# Patient Record
Sex: Female | Born: 1995 | Race: Black or African American | Hispanic: No | Marital: Single | Smoking: Never smoker
Health system: Southern US, Community
[De-identification: ages and names within clinical notes are randomized; demographics above are authoritative.]

## PROBLEM LIST (undated history)

## (undated) HISTORY — PX: OVARY SURGERY: SHX727

---
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
## 2018-02-16 ENCOUNTER — Encounter: Payer: Self-pay | Admitting: Emergency Medicine

## 2018-02-16 ENCOUNTER — Emergency Department
Admission: EM | Admit: 2018-02-16 | Discharge: 2018-02-16 | Disposition: A | Discharge status: Home / Self Care | Payer: Managed Care, Other (non HMO) | Attending: Emergency Medicine | Admitting: Emergency Medicine

## 2018-02-16 ENCOUNTER — Other Ambulatory Visit: Payer: Self-pay

## 2018-02-16 DIAGNOSIS — M545 Low back pain, unspecified: Secondary | ICD-10-CM

## 2018-02-16 DIAGNOSIS — Y999 Unspecified external cause status: Secondary | ICD-10-CM | POA: Diagnosis not present

## 2018-02-16 DIAGNOSIS — Y92410 Unspecified street and highway as the place of occurrence of the external cause: Secondary | ICD-10-CM | POA: Insufficient documentation

## 2018-02-16 DIAGNOSIS — Y9389 Activity, other specified: Secondary | ICD-10-CM | POA: Diagnosis not present

## 2018-02-16 MED ORDER — CYCLOBENZAPRINE HCL 5 MG PO TABS: ORAL_TABLET | ORAL | 0 refills | Status: DC

## 2018-02-16 MED ORDER — IBUPROFEN 600 MG PO TABS: 600.0000 mg | ORAL_TABLET | Freq: Four times a day (QID) | ORAL | 0 refills | Status: DC | PRN

## 2018-02-16 MED ORDER — KETOROLAC TROMETHAMINE 30 MG/ML IJ SOLN
30.0000 mg | Freq: Once | INTRAMUSCULAR | Status: AC
  Administered 2018-02-16: 30 mg via INTRAMUSCULAR
  Filled 2018-02-16: qty 1
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
## 2018-03-12 ENCOUNTER — Emergency Department
Admission: EM | Admit: 2018-03-12 | Discharge: 2018-03-12 | Disposition: A | Discharge status: Home / Self Care | Payer: Managed Care, Other (non HMO) | Attending: Student in an Organized Health Care Education/Training Program | Admitting: Student in an Organized Health Care Education/Training Program

## 2018-03-12 ENCOUNTER — Encounter: Payer: Self-pay | Admitting: Emergency Medicine

## 2018-03-12 DIAGNOSIS — M7918 Myalgia, other site: Secondary | ICD-10-CM | POA: Insufficient documentation

## 2018-03-12 DIAGNOSIS — M549 Dorsalgia, unspecified: Secondary | ICD-10-CM | POA: Diagnosis present

## 2018-03-12 MED ORDER — IBUPROFEN 600 MG PO TABS: 600.0000 mg | ORAL_TABLET | Freq: Four times a day (QID) | ORAL | 0 refills | Status: AC | PRN

## 2018-03-12 MED ORDER — CYCLOBENZAPRINE HCL 5 MG PO TABS: ORAL_TABLET | ORAL | 0 refills | Status: AC
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
## 2018-03-12 NOTE — Discharge Instructions (Addendum)
Follow-up with orthopedics.  You will need to call make an appointment.  Apply wet heat for 15 minutes then ice for 15 minutes.  Take medication as prescribed.  You need to see an orthopedic or your regular doctor for physical therapy.  Work limitations will be no lifting of greater than 10 pounds until evaluated by either your primary care doctor or orthopedic doctor.
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
## 2018-03-13 ENCOUNTER — Emergency Department: Payer: Managed Care, Other (non HMO)

## 2018-03-13 ENCOUNTER — Other Ambulatory Visit: Payer: Self-pay

## 2018-03-13 ENCOUNTER — Emergency Department
Admission: EM | Admit: 2018-03-13 | Discharge: 2018-03-13 | Disposition: A | Discharge status: Home / Self Care | Payer: Managed Care, Other (non HMO) | Attending: Emergency Medicine | Admitting: Emergency Medicine

## 2018-03-13 DIAGNOSIS — R102 Pelvic and perineal pain: Secondary | ICD-10-CM | POA: Diagnosis not present

## 2018-03-13 DIAGNOSIS — D369 Benign neoplasm, unspecified site: Secondary | ICD-10-CM | POA: Diagnosis not present

## 2018-03-13 LAB — URINALYSIS, COMPLETE (UACMP) WITH MICROSCOPIC
Bilirubin Urine: NEGATIVE
Glucose, UA: NEGATIVE mg/dL
HGB URINE DIPSTICK: NEGATIVE
Ketones, ur: 20 mg/dL — AB
Leukocytes, UA: NEGATIVE
Nitrite: NEGATIVE
Protein, ur: NEGATIVE mg/dL
SPECIFIC GRAVITY, URINE: 1.012 (ref 1.005–1.030)
pH: 6 (ref 5.0–8.0)

## 2018-03-13 LAB — POCT PREGNANCY, URINE: Preg Test, Ur: NEGATIVE

## 2018-03-13 NOTE — Discharge Instructions (Addendum)
Your ultrasound today does not show any significant change from last week's MRI.  Continue following up with [facility] as scheduled on August 15.  Return to the ER if you have sudden worsening of symptoms with severe, unrelenting pain. Take ibuprofen 600mg  and tylenol 650mg  every 6 hours to control pain.

## 2018-03-13 NOTE — ED Notes (Signed)
Pelvic pain intermit xfew months, dx with ovarian cysts xfew years ago.

## 2018-03-13 NOTE — ED Triage Notes (Signed)
Pt arrives to ED c/o dermoid cyst to LLQ all the way to RLQ that is 10cm. States pain x 1 week. Wants to be checked to make sure it didn't rupture. Alert, oriented, ambulatory. No distress noted. States doesn't have L ovary.

## 2018-03-13 NOTE — ED Provider Notes (Signed)
[facility] Emergency Department Provider Note  ____________________________________________  Time seen: Approximately 6:27 PM  I have reviewed the triage vital signs and the nursing notes.   HISTORY  Chief Complaint Pelvic Pain    HPI Latoya Woods is a 22 y.o. female with a history of a 10 cm dermoid cyst that was initially diagnosed approximately 2 years ago who complains of ongoing chronic pelvic pain.  States that recently she is noticed that it also hurts whenever she bends over or stretches and wanted to make sure that the cyst did not rupture.  Its in her diffuse lower abdomen but worse on the left.  Nonradiating, worse with bending and stretching, no alleviating factors.  Moderate intensity.  Aching.  No abnormal vaginal bleeding or discharge.  No dysuria  Review of electronic medical record shows negative STI work-up around January 21, 2018.      History reviewed. No pertinent past medical history.   There are no active problems to display for this patient.    Past Surgical History:  Procedure Laterality Date  . OVARY SURGERY       Prior to Admission medications   Medication Sig Start Date End Date Taking? Authorizing Provider  cyclobenzaprine (FLEXERIL) 5 MG tablet Take 1-2 tablets 3 times daily as needed 03/12/18   Caryn Section Linden Dolin, PA-C  ibuprofen (ADVIL,MOTRIN) 600 MG tablet Take 1 tablet (600 mg total) by mouth every 6 (six) hours as needed. 03/12/18   Versie Starks, PA-C     Allergies Patient has no known allergies.   History reviewed. No pertinent family history.  Social History Social History   Tobacco Use  . Smoking status: Never Smoker  . Smokeless tobacco: Never Used  Substance Use Topics  . Alcohol use: Yes    Frequency: Never    Comment: rarely  . Drug use: Never    Review of Systems  Constitutional:   No fever or chills.   Cardiovascular:   No chest pain or syncope. Respiratory:   No dyspnea or  cough. Gastrointestinal:   Positive chronic lower abdominal pain.  No vomiting constipation or diarrhea musculoskeletal:   Negative for focal pain or swelling All other systems reviewed and are negative except as documented above in ROS and HPI.  ____________________________________________   PHYSICAL EXAM:  VITAL SIGNS: ED Triage Vitals [03/13/18 1545]  Enc Vitals Group     BP 132/77     Pulse Rate 96     Resp 16     Temp 98.5 F (36.9 C)     Temp Source Oral     SpO2 99 %     Weight 160 lb (72.6 kg)     Height 5\' 5"  (1.651 m)     Head Circumference      Peak Flow      Pain Score 5     Pain Loc      Pain Edu?      Excl. in [place]?     Vital signs reviewed, nursing assessments reviewed.   Constitutional:   Alert and oriented. Non-toxic appearance. Eyes:   Conjunctivae are normal. EOMI. PERRL. ENT      Head:   Normocephalic and atraumatic.      Nose:   No congestion/rhinnorhea.       Mouth/Throat:   MMM, no pharyngeal erythema. No peritonsillar mass.       Neck:   No meningismus. Full ROM. Hematological/Lymphatic/Immunilogical:   No cervical lymphadenopathy. Cardiovascular:   RRR. Symmetric  bilateral radial and DP pulses.  No murmurs. Cap refill less than 2 seconds. Respiratory:   Normal respiratory effort without tachypnea/retractions. Breath sounds are clear and equal bilaterally. No wheezes/rales/rhonchi. Gastrointestinal:   Soft with fullness and mild tenderness in the left lower quadrant.  No right lower quadrant tenderness. Non distended. There is no CVA tenderness.  No rebound, rigidity, or guarding.  Musculoskeletal:   Normal range of motion in all extremities. No joint effusions.  No lower extremity tenderness.  No edema. Neurologic:   Normal speech and language.  Motor grossly intact. No acute focal neurologic deficits are appreciated.  Skin:    Skin is warm, dry and intact. No rash noted.  No petechiae, purpura, or  bullae.  ____________________________________________    LABS (pertinent positives/negatives) (all labs ordered are listed, but only abnormal results are displayed) Labs Reviewed  URINALYSIS, COMPLETE (UACMP) WITH MICROSCOPIC - Abnormal; Notable for the following components:      Result Value   Color, Urine YELLOW (*)    APPearance HAZY (*)    Ketones, ur 20 (*)    Bacteria, UA RARE (*)    All other components within normal limits  POC URINE PREG, ED  POCT PREGNANCY, URINE   ____________________________________________   EKG    ____________________________________________    RADIOLOGY  US Pelvis Transvanginal Non-ob (tv Only)  Result Date: 03/13/2018 CLINICAL DATA:  Pelvic pain, history of dermoid cyst EXAM: TRANSABDOMINAL AND TRANSVAGINAL ULTRASOUND OF PELVIS DOPPLER ULTRASOUND OF OVARIES TECHNIQUE: Both transabdominal and transvaginal ultrasound examinations of the pelvis were performed. Transabdominal technique was performed for global imaging of the pelvis including uterus, ovaries, adnexal regions, and pelvic cul-de-sac. It was necessary to proceed with endovaginal exam following the transabdominal exam to visualize the endometrium and ovaries. Color and duplex Doppler ultrasound was utilized to evaluate blood flow to the ovaries. COMPARISON:  None. FINDINGS: Uterus Measurements: 6 x 3.2 x 4.8 cm. No fibroids or other mass visualized. Endometrium Thickness: 6.5 mm.  No focal abnormality visualized. Right ovary Measurements: 3.3 x 1.9 x 2.9 cm. Small echogenic mass measuring 0.9 cm. Left ovary Not confidently identified. Pulsed Doppler evaluation of both ovaries demonstrates normal low-resistance arterial and venous waveforms. Other findings Large hyperechoic mass at the midline and extending to left adnexa measuring 7.6 x 5.2 x 10.3 cm. Small amount of free fluid in the pelvis. IMPRESSION: 1. Large hyperechoic midline and slightly to the left of midline mass measuring 10.3 cm,  presumably corresponding to history of dermoid. Left ovarian tissue could not be confidently identified and as such ovarian torsion on the left cannot be excluded. Recommend gynecologic surgical consultation. 2. Negative for right ovarian torsion. 9 mm hyperechoic focus in the right ovary may reflect hemorrhagic follicle or possibly a tiny dermoid. 3. Small free fluid in the pelvis Electronically Signed   By: Donavan Foil M.D.   On: 03/13/2018 17:53   US Pelvis Complete  Result Date: 03/13/2018 CLINICAL DATA:  Pelvic pain, history of dermoid cyst EXAM: TRANSABDOMINAL AND TRANSVAGINAL ULTRASOUND OF PELVIS DOPPLER ULTRASOUND OF OVARIES TECHNIQUE: Both transabdominal and transvaginal ultrasound examinations of the pelvis were performed. Transabdominal technique was performed for global imaging of the pelvis including uterus, ovaries, adnexal regions, and pelvic cul-de-sac. It was necessary to proceed with endovaginal exam following the transabdominal exam to visualize the endometrium and ovaries. Color and duplex Doppler ultrasound was utilized to evaluate blood flow to the ovaries. COMPARISON:  None. FINDINGS: Uterus Measurements: 6 x 3.2 x 4.8 cm. No fibroids  or other mass visualized. Endometrium Thickness: 6.5 mm.  No focal abnormality visualized. Right ovary Measurements: 3.3 x 1.9 x 2.9 cm. Small echogenic mass measuring 0.9 cm. Left ovary Not confidently identified. Pulsed Doppler evaluation of both ovaries demonstrates normal low-resistance arterial and venous waveforms. Other findings Large hyperechoic mass at the midline and extending to left adnexa measuring 7.6 x 5.2 x 10.3 cm. Small amount of free fluid in the pelvis. IMPRESSION: 1. Large hyperechoic midline and slightly to the left of midline mass measuring 10.3 cm, presumably corresponding to history of dermoid. Left ovarian tissue could not be confidently identified and as such ovarian torsion on the left cannot be excluded. Recommend gynecologic  surgical consultation. 2. Negative for right ovarian torsion. 9 mm hyperechoic focus in the right ovary may reflect hemorrhagic follicle or possibly a tiny dermoid. 3. Small free fluid in the pelvis Electronically Signed   By: Donavan Foil M.D.   On: 03/13/2018 17:53   US Abdominal Pelvic Art/vent Flow Doppler  Result Date: 03/13/2018 CLINICAL DATA:  Pelvic pain, history of dermoid cyst EXAM: TRANSABDOMINAL AND TRANSVAGINAL ULTRASOUND OF PELVIS DOPPLER ULTRASOUND OF OVARIES TECHNIQUE: Both transabdominal and transvaginal ultrasound examinations of the pelvis were performed. Transabdominal technique was performed for global imaging of the pelvis including uterus, ovaries, adnexal regions, and pelvic cul-de-sac. It was necessary to proceed with endovaginal exam following the transabdominal exam to visualize the endometrium and ovaries. Color and duplex Doppler ultrasound was utilized to evaluate blood flow to the ovaries. COMPARISON:  None. FINDINGS: Uterus Measurements: 6 x 3.2 x 4.8 cm. No fibroids or other mass visualized. Endometrium Thickness: 6.5 mm.  No focal abnormality visualized. Right ovary Measurements: 3.3 x 1.9 x 2.9 cm. Small echogenic mass measuring 0.9 cm. Left ovary Not confidently identified. Pulsed Doppler evaluation of both ovaries demonstrates normal low-resistance arterial and venous waveforms. Other findings Large hyperechoic mass at the midline and extending to left adnexa measuring 7.6 x 5.2 x 10.3 cm. Small amount of free fluid in the pelvis. IMPRESSION: 1. Large hyperechoic midline and slightly to the left of midline mass measuring 10.3 cm, presumably corresponding to history of dermoid. Left ovarian tissue could not be confidently identified and as such ovarian torsion on the left cannot be excluded. Recommend gynecologic surgical consultation. 2. Negative for right ovarian torsion. 9 mm hyperechoic focus in the right ovary may reflect hemorrhagic follicle or possibly a tiny dermoid.  3. Small free fluid in the pelvis Electronically Signed   By: Donavan Foil M.D.   On: 03/13/2018 17:53    ____________________________________________   PROCEDURES Procedures  ____________________________________________  DIFFERENTIAL DIAGNOSIS   Symptomatic ovarian cyst, possible torsion though unlikely, cystitis, pregnancy  CLINICAL IMPRESSION / ASSESSMENT AND PLAN / ED COURSE  Pertinent labs & imaging results that were available during my care of the patient were reviewed by me and considered in my medical decision making (see chart for details).    Patient nontoxic-appearing.  Vital signs are normal.  Mild to moderate pain, unchanged from baseline.  Presents with what sounds like a symptomatic ovarian cyst.  She already has surgical follow-up scheduled for 2 weeks from today.  No significant change today from the MRI she had a week ago.  Stable for continued outpatient management.  I doubt torsion and with a reassuring ultrasound, no further work-up needed at this time.  Doubt STI.  No evidence of urinary tract infection.  Not pregnant.      ____________________________________________   FINAL CLINICAL IMPRESSION(S) /  ED DIAGNOSES    Final diagnoses:  Dermoid cyst  Pelvic pain in female     ED Discharge Orders    None      Portions of this note were generated with dragon dictation software. Dictation errors may occur despite best attempts at proofreading.    Carrie Mew, MD 03/13/18 [phone]

## 2018-03-13 NOTE — ED Notes (Signed)
Pt taken to US

## 2018-09-07 ENCOUNTER — Encounter: Payer: Self-pay | Admitting: Emergency Medicine

## 2018-09-07 ENCOUNTER — Other Ambulatory Visit: Payer: Self-pay

## 2018-09-07 ENCOUNTER — Emergency Department
Admission: EM | Admit: 2018-09-07 | Discharge: 2018-09-07 | Disposition: A | Discharge status: Home / Self Care | Payer: Managed Care, Other (non HMO) | Attending: Emergency Medicine | Admitting: Emergency Medicine

## 2018-09-07 DIAGNOSIS — G8929 Other chronic pain: Secondary | ICD-10-CM | POA: Diagnosis not present

## 2018-09-07 DIAGNOSIS — M545 Low back pain, unspecified: Secondary | ICD-10-CM

## 2018-09-07 DIAGNOSIS — Z79899 Other long term (current) drug therapy: Secondary | ICD-10-CM | POA: Diagnosis not present

## 2018-09-07 DIAGNOSIS — M25571 Pain in right ankle and joints of right foot: Secondary | ICD-10-CM
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
## 2018-09-07 NOTE — Discharge Instructions (Addendum)
Call make an appointment with your primary care provider for next week.  Ice and elevate your ankle as needed for pain.  Wear ankle splint for protection and added support.  Continue taking the medication that was prescribed to you by the emergency department in [place].
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
